# Patient Record
Sex: Female | Born: 1950 | Race: White | Hispanic: No | Marital: Married
Health system: Southern US, Community
[De-identification: ages and names within clinical notes are randomized; demographics above are authoritative.]

---

## 1996-09-08 HISTORY — PX: BREAST BIOPSY: SHX20

## 1999-07-04 ENCOUNTER — Encounter: Payer: Self-pay | Admitting: Family Medicine

## 1999-07-04 ENCOUNTER — Encounter: Admission: RE | Admit: 1999-07-04 | Discharge: 1999-07-04 | Payer: Self-pay | Admitting: Family Medicine

## 1999-07-08 ENCOUNTER — Other Ambulatory Visit: Admission: RE | Admit: 1999-07-08 | Discharge: 1999-07-08 | Payer: Self-pay | Admitting: Family Medicine

## 2000-07-21 ENCOUNTER — Encounter: Payer: Self-pay | Admitting: Family Medicine

## 2000-07-21 ENCOUNTER — Encounter: Admission: RE | Admit: 2000-07-21 | Discharge: 2000-07-21 | Payer: Self-pay | Admitting: Family Medicine

## 2000-07-22 ENCOUNTER — Other Ambulatory Visit: Admission: RE | Admit: 2000-07-22 | Discharge: 2000-07-22 | Payer: Self-pay | Admitting: Family Medicine

## 2001-08-02 ENCOUNTER — Encounter: Admission: RE | Admit: 2001-08-02 | Discharge: 2001-08-02 | Payer: Self-pay | Admitting: Family Medicine

## 2001-08-02 ENCOUNTER — Encounter: Payer: Self-pay | Admitting: Family Medicine

## 2001-08-17 ENCOUNTER — Other Ambulatory Visit: Admission: RE | Admit: 2001-08-17 | Discharge: 2001-08-17 | Payer: Self-pay | Admitting: Family Medicine

## 2001-08-24 ENCOUNTER — Encounter: Payer: Self-pay | Admitting: Family Medicine

## 2001-08-24 ENCOUNTER — Encounter: Admission: RE | Admit: 2001-08-24 | Discharge: 2001-08-24 | Payer: Self-pay | Admitting: Family Medicine

## 2002-08-10 ENCOUNTER — Encounter: Admission: RE | Admit: 2002-08-10 | Discharge: 2002-08-10 | Payer: Self-pay | Admitting: Family Medicine

## 2002-08-10 ENCOUNTER — Encounter: Payer: Self-pay | Admitting: Family Medicine

## 2002-08-19 ENCOUNTER — Other Ambulatory Visit: Admission: RE | Admit: 2002-08-19 | Discharge: 2002-08-19 | Payer: Self-pay | Admitting: Family Medicine

## 2002-12-22 ENCOUNTER — Ambulatory Visit (HOSPITAL_COMMUNITY): Admission: RE | Admit: 2002-12-22 | Discharge: 2002-12-22 | Payer: Self-pay

## 2002-12-22 ENCOUNTER — Encounter (INDEPENDENT_AMBULATORY_CARE_PROVIDER_SITE_OTHER): Payer: Self-pay | Admitting: Specialist

## 2003-08-21 ENCOUNTER — Other Ambulatory Visit: Admission: RE | Admit: 2003-08-21 | Discharge: 2003-08-21 | Payer: Self-pay

## 2003-09-19 ENCOUNTER — Encounter: Admission: RE | Admit: 2003-09-19 | Discharge: 2003-09-19 | Payer: Self-pay | Admitting: Family Medicine

## 2004-09-24 ENCOUNTER — Other Ambulatory Visit: Admission: RE | Admit: 2004-09-24 | Discharge: 2004-09-24 | Payer: Self-pay | Admitting: Family Medicine

## 2004-10-10 ENCOUNTER — Encounter: Admission: RE | Admit: 2004-10-10 | Discharge: 2004-10-10 | Payer: Self-pay | Admitting: Family Medicine

## 2005-10-31 ENCOUNTER — Other Ambulatory Visit: Admission: RE | Admit: 2005-10-31 | Discharge: 2005-10-31 | Payer: Self-pay | Admitting: Family Medicine

## 2005-11-03 ENCOUNTER — Encounter: Admission: RE | Admit: 2005-11-03 | Discharge: 2005-11-03 | Payer: Self-pay | Admitting: Family Medicine

## 2007-01-04 ENCOUNTER — Encounter: Admission: RE | Admit: 2007-01-04 | Discharge: 2007-01-04 | Payer: Self-pay | Admitting: Family Medicine

## 2007-03-03 ENCOUNTER — Other Ambulatory Visit: Admission: RE | Admit: 2007-03-03 | Discharge: 2007-03-03 | Payer: Self-pay | Admitting: Family Medicine

## 2008-04-05 ENCOUNTER — Encounter: Admission: RE | Admit: 2008-04-05 | Discharge: 2008-04-05 | Payer: Self-pay | Admitting: Family Medicine

## 2008-05-31 ENCOUNTER — Other Ambulatory Visit: Admission: RE | Admit: 2008-05-31 | Discharge: 2008-05-31 | Payer: Self-pay | Admitting: Family Medicine

## 2009-04-17 ENCOUNTER — Encounter: Admission: RE | Admit: 2009-04-17 | Discharge: 2009-04-17 | Payer: Self-pay | Admitting: Family Medicine

## 2009-06-20 ENCOUNTER — Other Ambulatory Visit: Admission: RE | Admit: 2009-06-20 | Discharge: 2009-06-20 | Payer: Self-pay | Admitting: Family Medicine

## 2010-04-18 ENCOUNTER — Encounter: Admission: RE | Admit: 2010-04-18 | Discharge: 2010-04-18 | Payer: Self-pay | Admitting: Family Medicine

## 2010-08-23 ENCOUNTER — Other Ambulatory Visit
Admission: RE | Admit: 2010-08-23 | Discharge: 2010-08-23 | Payer: Self-pay | Source: Home / Self Care | Admitting: Family Medicine

## 2010-09-28 ENCOUNTER — Encounter: Payer: Self-pay | Admitting: Family Medicine

## 2011-01-24 NOTE — Op Note (Signed)
   NAME:  [name], Katie                             ACCOUNT NO.:  [id number]   MEDICAL RECORD NO.:  [id number]                   PATIENT TYPE:  AMB   LOCATION:  ENDO                                 FACILITY:  [facility]   PHYSICIAN:  John C. Madilyn Fireman, M.D.                 DATE OF BIRTH:  Nov 18, 1950   DATE OF PROCEDURE:  DATE OF DISCHARGE:                                 OPERATIVE REPORT   PROCEDURE:  Colonoscopy with polypectomy.   ENDOSCOPIST:  Everardo All.  Madilyn Fireman, M.D.   INDICATIONS:  Colon cancer screening.   DESCRIPTION OF PROCEDURE:  The patient was placed in the left lateral  decubitus position and placed on the pulse monitor with continuous low flow  oxygen delivered by nasal cannula.  She was sedated with 100 mcg of IV  fentanyl and 9 mg of IV Versed.  The Olympus video colonoscope was inserted  into the rectum and advanced to the cecum, confirmed by transillumination of  McBurney's point and visualization of the ileocecal valve and appendiceal  orifice.  The prep was excellent.  Within the base of the incision, there  was a 6 mm polyp which was fulgurated by hot biopsy.  The remainder of the  cecum, ascending, transverse and descending colon appeared normal with no  further polyps, masses diverticula or other mucosal abnormalities.  In the  distal sigmoid colon, there was seen a flat 8 mm polyp which was fulgurated  by hot biopsy a few centimeters distal to this.  In the rectosigmoid  junction area was a 1 cm pedunculated polyp which was removed by snare.  Both of the distal polyps were sent in a second specimen container.  The  rectum appeared normal and retroflexed view of the anus revealed no obvious  internal hemorrhoids.  The colonoscope was then withdrawn and the patient  returned to the recovery room in stable condition.  She tolerated the  procedure, and there were no immediate complications.   IMPRESSION:  Cecal, sigmoid and rectosigmoid colon polyps.   PLAN:  Await histology  to determine method and interval for future colon  screening.                                               John C. Madilyn Fireman, M.D.    [facility]  D:  12/22/2002  T:  12/22/2002  Job:  [id number]   cc:   Schuyler Amor, M.D.  [address]  [place], [place] [address]  Fax: [phone]

## 2011-03-20 ENCOUNTER — Other Ambulatory Visit: Payer: Self-pay | Admitting: Family Medicine

## 2011-03-20 DIAGNOSIS — Z1231 Encounter for screening mammogram for malignant neoplasm of breast: Secondary | ICD-10-CM

## 2011-04-15 ENCOUNTER — Other Ambulatory Visit: Payer: Self-pay | Admitting: Gastroenterology

## 2011-05-06 ENCOUNTER — Ambulatory Visit
Admission: RE | Admit: 2011-05-06 | Discharge: 2011-05-06 | Disposition: A | Discharge status: Home / Self Care | Payer: BC Managed Care – PPO | Source: Ambulatory Visit | Attending: Family Medicine | Admitting: Family Medicine

## 2011-05-06 DIAGNOSIS — Z1231 Encounter for screening mammogram for malignant neoplasm of breast: Secondary | ICD-10-CM

## 2011-10-17 ENCOUNTER — Other Ambulatory Visit (HOSPITAL_COMMUNITY)
Admission: RE | Admit: 2011-10-17 | Discharge: 2011-10-17 | Disposition: A | Discharge status: Home / Self Care | Payer: BC Managed Care – PPO | Source: Ambulatory Visit | Attending: Family Medicine | Admitting: Family Medicine

## 2011-10-17 ENCOUNTER — Other Ambulatory Visit: Payer: Self-pay | Admitting: Family Medicine

## 2011-10-17 DIAGNOSIS — Z124 Encounter for screening for malignant neoplasm of cervix: Secondary | ICD-10-CM | POA: Insufficient documentation

## 2013-03-14 ENCOUNTER — Other Ambulatory Visit: Payer: Self-pay

## 2013-03-14 DIAGNOSIS — Z1231 Encounter for screening mammogram for malignant neoplasm of breast: Secondary | ICD-10-CM

## 2013-04-07 ENCOUNTER — Ambulatory Visit: Payer: BC Managed Care – PPO

## 2013-04-13 ENCOUNTER — Ambulatory Visit
Admission: RE | Admit: 2013-04-13 | Discharge: 2013-04-13 | Disposition: A | Discharge status: Home / Self Care | Payer: BC Managed Care – PPO | Source: Ambulatory Visit

## 2013-04-13 DIAGNOSIS — Z1231 Encounter for screening mammogram for malignant neoplasm of breast: Secondary | ICD-10-CM

## 2014-04-05 ENCOUNTER — Other Ambulatory Visit: Payer: Self-pay

## 2014-04-05 DIAGNOSIS — Z1231 Encounter for screening mammogram for malignant neoplasm of breast: Secondary | ICD-10-CM

## 2014-04-20 ENCOUNTER — Ambulatory Visit
Admission: RE | Admit: 2014-04-20 | Discharge: 2014-04-20 | Disposition: A | Discharge status: Home / Self Care | Payer: BC Managed Care – PPO | Source: Ambulatory Visit

## 2014-04-20 ENCOUNTER — Encounter (INDEPENDENT_AMBULATORY_CARE_PROVIDER_SITE_OTHER): Payer: Self-pay

## 2014-04-20 DIAGNOSIS — Z1231 Encounter for screening mammogram for malignant neoplasm of breast: Secondary | ICD-10-CM

## 2015-03-23 ENCOUNTER — Other Ambulatory Visit (HOSPITAL_COMMUNITY)
Admission: RE | Admit: 2015-03-23 | Discharge: 2015-03-23 | Disposition: A | Discharge status: Home / Self Care | Payer: BLUE CROSS/BLUE SHIELD | Source: Ambulatory Visit | Attending: Family Medicine | Admitting: Family Medicine

## 2015-03-23 ENCOUNTER — Other Ambulatory Visit: Payer: Self-pay | Admitting: Family Medicine

## 2015-03-23 DIAGNOSIS — Z01419 Encounter for gynecological examination (general) (routine) without abnormal findings: Secondary | ICD-10-CM | POA: Diagnosis present

## 2015-03-23 DIAGNOSIS — Z1151 Encounter for screening for human papillomavirus (HPV): Secondary | ICD-10-CM | POA: Diagnosis present

## 2015-03-28 LAB — CYTOLOGY - PAP

## 2015-06-06 ENCOUNTER — Other Ambulatory Visit: Payer: Self-pay

## 2015-06-06 DIAGNOSIS — Z1231 Encounter for screening mammogram for malignant neoplasm of breast: Secondary | ICD-10-CM

## 2015-07-12 ENCOUNTER — Ambulatory Visit
Admission: RE | Admit: 2015-07-12 | Discharge: 2015-07-12 | Disposition: A | Discharge status: Home / Self Care | Payer: BLUE CROSS/BLUE SHIELD | Source: Ambulatory Visit

## 2015-07-12 DIAGNOSIS — Z1231 Encounter for screening mammogram for malignant neoplasm of breast: Secondary | ICD-10-CM

## 2016-03-28 DIAGNOSIS — Z961 Presence of intraocular lens: Secondary | ICD-10-CM | POA: Diagnosis not present

## 2016-03-31 DIAGNOSIS — E785 Hyperlipidemia, unspecified: Secondary | ICD-10-CM | POA: Diagnosis not present

## 2016-03-31 DIAGNOSIS — Z23 Encounter for immunization: Secondary | ICD-10-CM | POA: Diagnosis not present

## 2016-03-31 DIAGNOSIS — I10 Essential (primary) hypertension: Secondary | ICD-10-CM | POA: Diagnosis not present

## 2016-03-31 DIAGNOSIS — B009 Herpesviral infection, unspecified: Secondary | ICD-10-CM | POA: Diagnosis not present

## 2016-03-31 DIAGNOSIS — Z0000 Encounter for general adult medical examination without abnormal findings: Secondary | ICD-10-CM | POA: Diagnosis not present

## 2016-05-08 DIAGNOSIS — Z23 Encounter for immunization: Secondary | ICD-10-CM | POA: Diagnosis not present

## 2016-09-17 ENCOUNTER — Other Ambulatory Visit: Payer: Self-pay | Admitting: Family Medicine

## 2016-09-17 DIAGNOSIS — Z1231 Encounter for screening mammogram for malignant neoplasm of breast: Secondary | ICD-10-CM

## 2016-10-14 ENCOUNTER — Ambulatory Visit
Admission: RE | Admit: 2016-10-14 | Discharge: 2016-10-14 | Disposition: A | Discharge status: Home / Self Care | Payer: Medicare Other | Source: Ambulatory Visit | Attending: Family Medicine | Admitting: Family Medicine

## 2016-10-14 DIAGNOSIS — Z1231 Encounter for screening mammogram for malignant neoplasm of breast: Secondary | ICD-10-CM | POA: Diagnosis not present

## 2017-03-30 DIAGNOSIS — Z961 Presence of intraocular lens: Secondary | ICD-10-CM | POA: Diagnosis not present

## 2017-04-13 DIAGNOSIS — E785 Hyperlipidemia, unspecified: Secondary | ICD-10-CM | POA: Diagnosis not present

## 2017-04-13 DIAGNOSIS — B009 Herpesviral infection, unspecified: Secondary | ICD-10-CM | POA: Diagnosis not present

## 2017-04-13 DIAGNOSIS — Z136 Encounter for screening for cardiovascular disorders: Secondary | ICD-10-CM | POA: Diagnosis not present

## 2017-04-13 DIAGNOSIS — Z0000 Encounter for general adult medical examination without abnormal findings: Secondary | ICD-10-CM | POA: Diagnosis not present

## 2017-04-13 DIAGNOSIS — R739 Hyperglycemia, unspecified: Secondary | ICD-10-CM | POA: Diagnosis not present

## 2017-04-13 DIAGNOSIS — I10 Essential (primary) hypertension: Secondary | ICD-10-CM | POA: Diagnosis not present

## 2017-04-13 DIAGNOSIS — Q180 Sinus, fistula and cyst of branchial cleft: Secondary | ICD-10-CM | POA: Diagnosis not present

## 2017-04-13 DIAGNOSIS — Z23 Encounter for immunization: Secondary | ICD-10-CM | POA: Diagnosis not present

## 2017-05-13 DIAGNOSIS — Z23 Encounter for immunization: Secondary | ICD-10-CM | POA: Diagnosis not present

## 2017-06-09 DIAGNOSIS — E2839 Other primary ovarian failure: Secondary | ICD-10-CM | POA: Diagnosis not present

## 2017-12-03 ENCOUNTER — Other Ambulatory Visit: Payer: Self-pay | Admitting: Family Medicine

## 2017-12-03 DIAGNOSIS — Z1231 Encounter for screening mammogram for malignant neoplasm of breast: Secondary | ICD-10-CM

## 2017-12-25 ENCOUNTER — Ambulatory Visit
Admission: RE | Admit: 2017-12-25 | Discharge: 2017-12-25 | Disposition: A | Discharge status: Home / Self Care | Payer: Medicare Other | Source: Ambulatory Visit | Attending: Family Medicine | Admitting: Family Medicine

## 2017-12-25 DIAGNOSIS — Z1231 Encounter for screening mammogram for malignant neoplasm of breast: Secondary | ICD-10-CM

## 2018-04-08 DIAGNOSIS — Z961 Presence of intraocular lens: Secondary | ICD-10-CM | POA: Diagnosis not present

## 2018-04-08 DIAGNOSIS — H43813 Vitreous degeneration, bilateral: Secondary | ICD-10-CM | POA: Diagnosis not present

## 2018-04-27 DIAGNOSIS — E785 Hyperlipidemia, unspecified: Secondary | ICD-10-CM | POA: Diagnosis not present

## 2018-04-27 DIAGNOSIS — R739 Hyperglycemia, unspecified: Secondary | ICD-10-CM | POA: Diagnosis not present

## 2018-04-27 DIAGNOSIS — Z0000 Encounter for general adult medical examination without abnormal findings: Secondary | ICD-10-CM | POA: Diagnosis not present

## 2018-04-27 DIAGNOSIS — I10 Essential (primary) hypertension: Secondary | ICD-10-CM | POA: Diagnosis not present

## 2018-05-12 DIAGNOSIS — Q180 Sinus, fistula and cyst of branchial cleft: Secondary | ICD-10-CM | POA: Diagnosis not present

## 2018-05-21 DIAGNOSIS — Z23 Encounter for immunization: Secondary | ICD-10-CM | POA: Diagnosis not present

## 2019-02-01 ENCOUNTER — Other Ambulatory Visit: Payer: Self-pay | Admitting: Family Medicine

## 2019-02-01 DIAGNOSIS — Z1231 Encounter for screening mammogram for malignant neoplasm of breast: Secondary | ICD-10-CM

## 2019-03-23 ENCOUNTER — Other Ambulatory Visit: Payer: Self-pay

## 2019-03-23 ENCOUNTER — Ambulatory Visit
Admission: RE | Admit: 2019-03-23 | Discharge: 2019-03-23 | Disposition: A | Discharge status: Home / Self Care | Payer: Medicare Other | Source: Ambulatory Visit | Attending: Family Medicine | Admitting: Family Medicine

## 2019-03-23 DIAGNOSIS — Z1231 Encounter for screening mammogram for malignant neoplasm of breast: Secondary | ICD-10-CM

## 2019-04-14 DIAGNOSIS — Z961 Presence of intraocular lens: Secondary | ICD-10-CM | POA: Diagnosis not present

## 2019-04-14 DIAGNOSIS — H43813 Vitreous degeneration, bilateral: Secondary | ICD-10-CM | POA: Diagnosis not present

## 2019-05-05 DIAGNOSIS — L718 Other rosacea: Secondary | ICD-10-CM | POA: Diagnosis not present

## 2019-05-05 DIAGNOSIS — L820 Inflamed seborrheic keratosis: Secondary | ICD-10-CM | POA: Diagnosis not present

## 2019-05-05 DIAGNOSIS — L738 Other specified follicular disorders: Secondary | ICD-10-CM | POA: Diagnosis not present

## 2019-05-05 DIAGNOSIS — D2272 Melanocytic nevi of left lower limb, including hip: Secondary | ICD-10-CM | POA: Diagnosis not present

## 2019-05-05 DIAGNOSIS — D2371 Other benign neoplasm of skin of right lower limb, including hip: Secondary | ICD-10-CM | POA: Diagnosis not present

## 2019-05-05 DIAGNOSIS — L821 Other seborrheic keratosis: Secondary | ICD-10-CM | POA: Diagnosis not present

## 2019-05-19 DIAGNOSIS — Z23 Encounter for immunization: Secondary | ICD-10-CM | POA: Diagnosis not present

## 2019-05-20 DIAGNOSIS — E785 Hyperlipidemia, unspecified: Secondary | ICD-10-CM | POA: Diagnosis not present

## 2019-05-20 DIAGNOSIS — R739 Hyperglycemia, unspecified: Secondary | ICD-10-CM | POA: Diagnosis not present

## 2019-05-20 DIAGNOSIS — I10 Essential (primary) hypertension: Secondary | ICD-10-CM | POA: Diagnosis not present

## 2019-05-20 DIAGNOSIS — Q180 Sinus, fistula and cyst of branchial cleft: Secondary | ICD-10-CM | POA: Diagnosis not present

## 2019-05-20 DIAGNOSIS — Z0000 Encounter for general adult medical examination without abnormal findings: Secondary | ICD-10-CM | POA: Diagnosis not present

## 2019-09-28 ENCOUNTER — Ambulatory Visit: Payer: Medicare Other | Attending: Internal Medicine

## 2019-09-28 DIAGNOSIS — Z23 Encounter for immunization: Secondary | ICD-10-CM | POA: Diagnosis not present

## 2019-09-28 NOTE — Progress Notes (Signed)
   Covid-19 Vaccination Clinic  Name:  Jessyka Atlas    MRN: [id number] DOB: [birth year]  09/28/2019  Ms. Koesters was observed post Covid-19 immunization for 15 minutes without incidence. She was provided with Vaccine Information Sheet and instruction to access the V-Safe system.   Ms. Conneely was instructed to call 911 with any severe reactions post vaccine: Marland Kitchen Difficulty breathing  . Swelling of your face and throat  . A fast heartbeat  . A bad rash all over your body  . Dizziness and weakness    Immunizations Administered    Name Date Dose VIS Date Route   Pfizer COVID-19 Vaccine 09/28/2019  1:44 PM 0.3 mL 08/19/2019 Intramuscular   Manufacturer: [place]   Lot: [id number]   [place]: [id number]

## 2019-10-16 ENCOUNTER — Ambulatory Visit: Payer: Medicare Other | Attending: Internal Medicine

## 2019-10-16 DIAGNOSIS — Z23 Encounter for immunization: Secondary | ICD-10-CM

## 2019-10-16 NOTE — Progress Notes (Signed)
   Covid-19 Vaccination Clinic  Name:  Katie [name]    MRN: [id number] DOB: 1951/09/06  10/16/2019  Ms. Schloemer was observed post Covid-19 immunization for 15 minutes without incidence. She was provided with Vaccine Information Sheet and instruction to access the V-Safe system.   Ms. Pollard was instructed to call 911 with any severe reactions post vaccine: Marland Kitchen Difficulty breathing  . Swelling of your face and throat  . A fast heartbeat  . A bad rash all over your body  . Dizziness and weakness    Immunizations Administered    Name Date Dose VIS Date Route   Pfizer COVID-19 Vaccine 10/16/2019 11:37 AM 0.3 mL 08/19/2019 Intramuscular   Manufacturer: [place]   Lot: [id number]   [place]: [id number]

## 2020-02-22 ENCOUNTER — Other Ambulatory Visit: Payer: Self-pay | Admitting: Family Medicine

## 2020-02-22 DIAGNOSIS — Z1231 Encounter for screening mammogram for malignant neoplasm of breast: Secondary | ICD-10-CM

## 2020-03-23 ENCOUNTER — Ambulatory Visit
Admission: RE | Admit: 2020-03-23 | Discharge: 2020-03-23 | Disposition: A | Discharge status: Home / Self Care | Payer: Medicare Other | Source: Ambulatory Visit | Attending: Family Medicine | Admitting: Family Medicine

## 2020-03-23 ENCOUNTER — Other Ambulatory Visit: Payer: Self-pay

## 2020-03-23 DIAGNOSIS — Z1231 Encounter for screening mammogram for malignant neoplasm of breast: Secondary | ICD-10-CM

## 2020-04-26 DIAGNOSIS — Z961 Presence of intraocular lens: Secondary | ICD-10-CM | POA: Diagnosis not present

## 2020-04-27 DIAGNOSIS — Z23 Encounter for immunization: Secondary | ICD-10-CM | POA: Diagnosis not present

## 2020-05-31 DIAGNOSIS — I10 Essential (primary) hypertension: Secondary | ICD-10-CM | POA: Diagnosis not present

## 2020-05-31 DIAGNOSIS — E785 Hyperlipidemia, unspecified: Secondary | ICD-10-CM | POA: Diagnosis not present

## 2020-05-31 DIAGNOSIS — B009 Herpesviral infection, unspecified: Secondary | ICD-10-CM | POA: Diagnosis not present

## 2020-05-31 DIAGNOSIS — Z0000 Encounter for general adult medical examination without abnormal findings: Secondary | ICD-10-CM | POA: Diagnosis not present

## 2020-06-06 DIAGNOSIS — Z23 Encounter for immunization: Secondary | ICD-10-CM | POA: Diagnosis not present

## 2020-09-06 DIAGNOSIS — Z20822 Contact with and (suspected) exposure to covid-19: Secondary | ICD-10-CM | POA: Diagnosis not present

## 2020-09-06 DIAGNOSIS — Z03818 Encounter for observation for suspected exposure to other biological agents ruled out: Secondary | ICD-10-CM | POA: Diagnosis not present

## 2020-11-01 DIAGNOSIS — Z01812 Encounter for preprocedural laboratory examination: Secondary | ICD-10-CM | POA: Diagnosis not present

## 2020-11-06 DIAGNOSIS — K648 Other hemorrhoids: Secondary | ICD-10-CM | POA: Diagnosis not present

## 2020-11-06 DIAGNOSIS — D122 Benign neoplasm of ascending colon: Secondary | ICD-10-CM | POA: Diagnosis not present

## 2020-11-06 DIAGNOSIS — D120 Benign neoplasm of cecum: Secondary | ICD-10-CM | POA: Diagnosis not present

## 2020-11-06 DIAGNOSIS — D124 Benign neoplasm of descending colon: Secondary | ICD-10-CM | POA: Diagnosis not present

## 2020-11-06 DIAGNOSIS — K621 Rectal polyp: Secondary | ICD-10-CM | POA: Diagnosis not present

## 2020-11-06 DIAGNOSIS — Z86010 Personal history of colonic polyps: Secondary | ICD-10-CM | POA: Diagnosis not present

## 2020-11-09 DIAGNOSIS — K621 Rectal polyp: Secondary | ICD-10-CM | POA: Diagnosis not present

## 2020-11-09 DIAGNOSIS — D122 Benign neoplasm of ascending colon: Secondary | ICD-10-CM | POA: Diagnosis not present

## 2020-11-09 DIAGNOSIS — D124 Benign neoplasm of descending colon: Secondary | ICD-10-CM | POA: Diagnosis not present

## 2020-11-09 DIAGNOSIS — D120 Benign neoplasm of cecum: Secondary | ICD-10-CM | POA: Diagnosis not present

## 2021-02-25 ENCOUNTER — Other Ambulatory Visit: Payer: Self-pay | Admitting: Family Medicine

## 2021-02-25 DIAGNOSIS — Z1231 Encounter for screening mammogram for malignant neoplasm of breast: Secondary | ICD-10-CM

## 2021-04-02 ENCOUNTER — Ambulatory Visit
Admission: RE | Admit: 2021-04-02 | Discharge: 2021-04-02 | Disposition: A | Discharge status: Home / Self Care | Payer: Medicare Other | Source: Ambulatory Visit | Attending: Family Medicine | Admitting: Family Medicine

## 2021-04-02 ENCOUNTER — Other Ambulatory Visit: Payer: Self-pay

## 2021-04-02 DIAGNOSIS — Z1231 Encounter for screening mammogram for malignant neoplasm of breast: Secondary | ICD-10-CM | POA: Diagnosis not present

## 2021-04-26 DIAGNOSIS — M1612 Unilateral primary osteoarthritis, left hip: Secondary | ICD-10-CM | POA: Diagnosis not present

## 2021-04-26 DIAGNOSIS — M25552 Pain in left hip: Secondary | ICD-10-CM | POA: Diagnosis not present

## 2021-04-26 DIAGNOSIS — M1611 Unilateral primary osteoarthritis, right hip: Secondary | ICD-10-CM | POA: Diagnosis not present

## 2021-04-26 DIAGNOSIS — M25551 Pain in right hip: Secondary | ICD-10-CM | POA: Diagnosis not present

## 2021-05-02 DIAGNOSIS — Z961 Presence of intraocular lens: Secondary | ICD-10-CM | POA: Diagnosis not present

## 2021-06-10 DIAGNOSIS — Z79899 Other long term (current) drug therapy: Secondary | ICD-10-CM | POA: Diagnosis not present

## 2021-06-10 DIAGNOSIS — Z0001 Encounter for general adult medical examination with abnormal findings: Secondary | ICD-10-CM | POA: Diagnosis not present

## 2021-06-10 DIAGNOSIS — Z23 Encounter for immunization: Secondary | ICD-10-CM | POA: Diagnosis not present

## 2021-06-10 DIAGNOSIS — I10 Essential (primary) hypertension: Secondary | ICD-10-CM | POA: Diagnosis not present

## 2021-06-10 DIAGNOSIS — E7800 Pure hypercholesterolemia, unspecified: Secondary | ICD-10-CM | POA: Diagnosis not present

## 2021-08-30 IMAGING — MG MM DIGITAL SCREENING BILAT W/ TOMO AND CAD
8 series · 8 of 24 positions shown · non-contrast
Comparison: Previous exam(s).

CLINICAL DATA: Screening.

EXAM:
DIGITAL SCREENING BILATERAL MAMMOGRAM WITH TOMOSYNTHESIS AND CAD
TECHNIQUE: Bilateral screening digital craniocaudal and mediolateral oblique
mammograms were obtained. Bilateral screening digital breast
tomosynthesis was performed. The images were evaluated with
computer-aided detection.

[L MLO synth-2D]
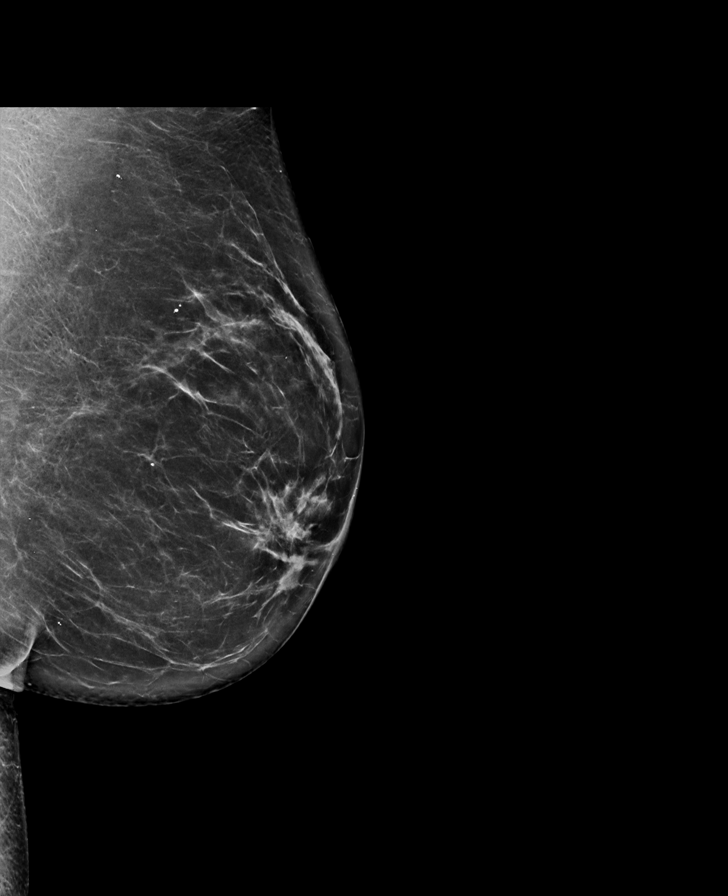

[R CC synth-2D]
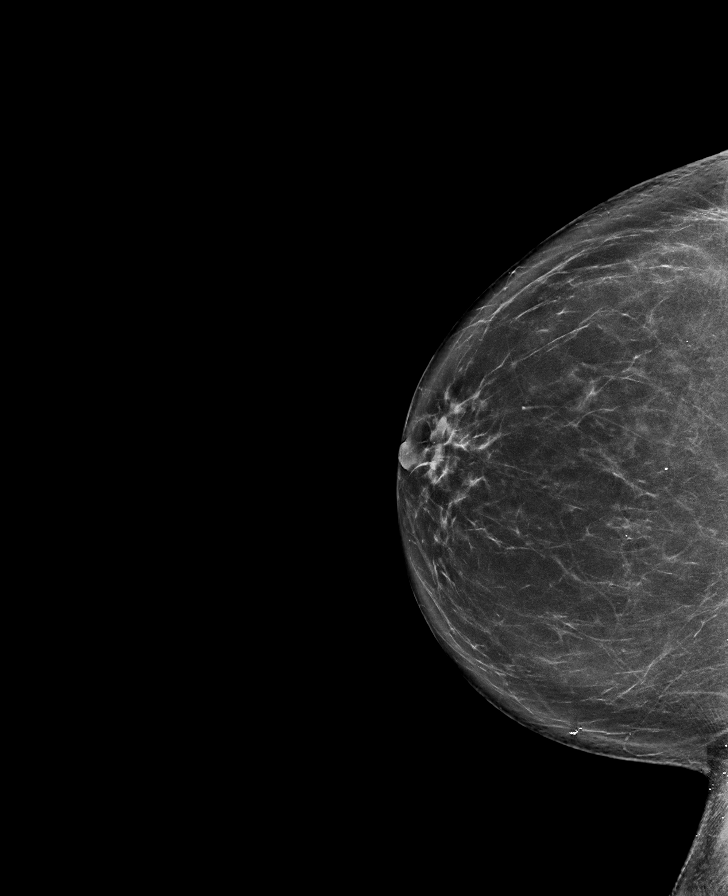

[L CC synth-2D]
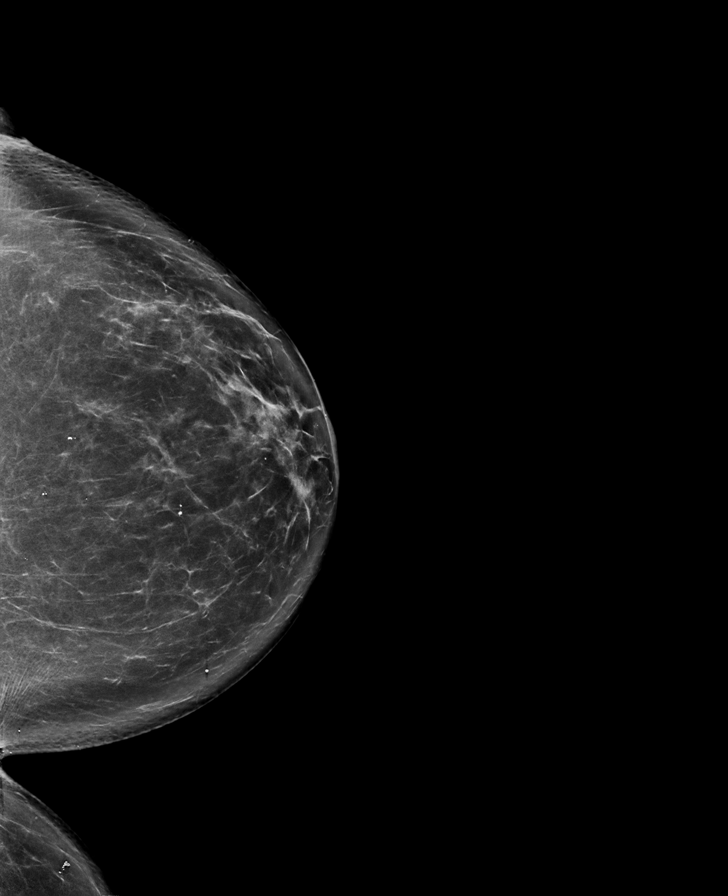

[R MLO synth-2D]
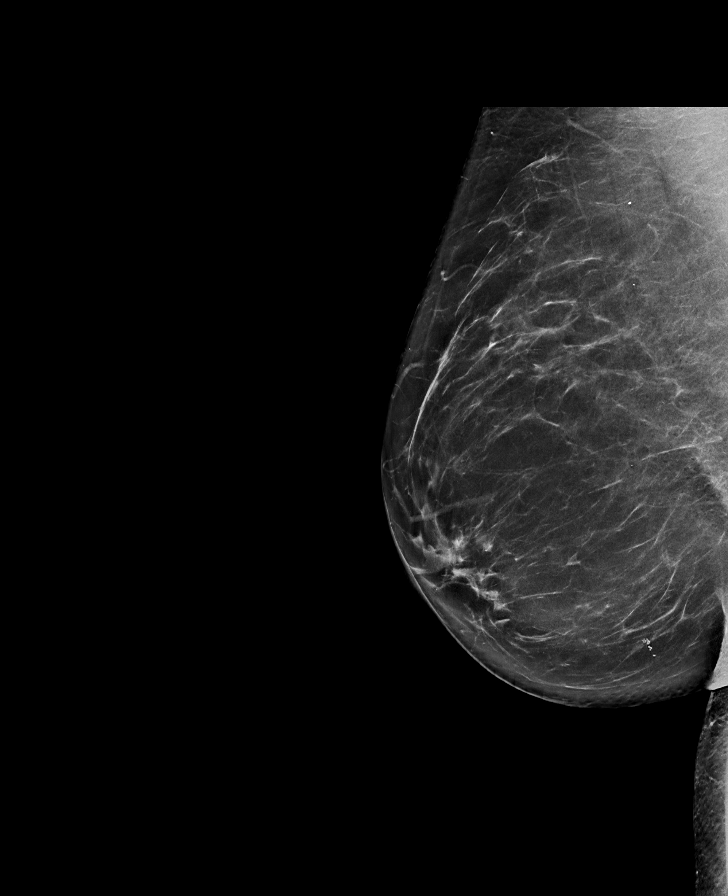

[R CC tomo · tomo slice 41/80.0]
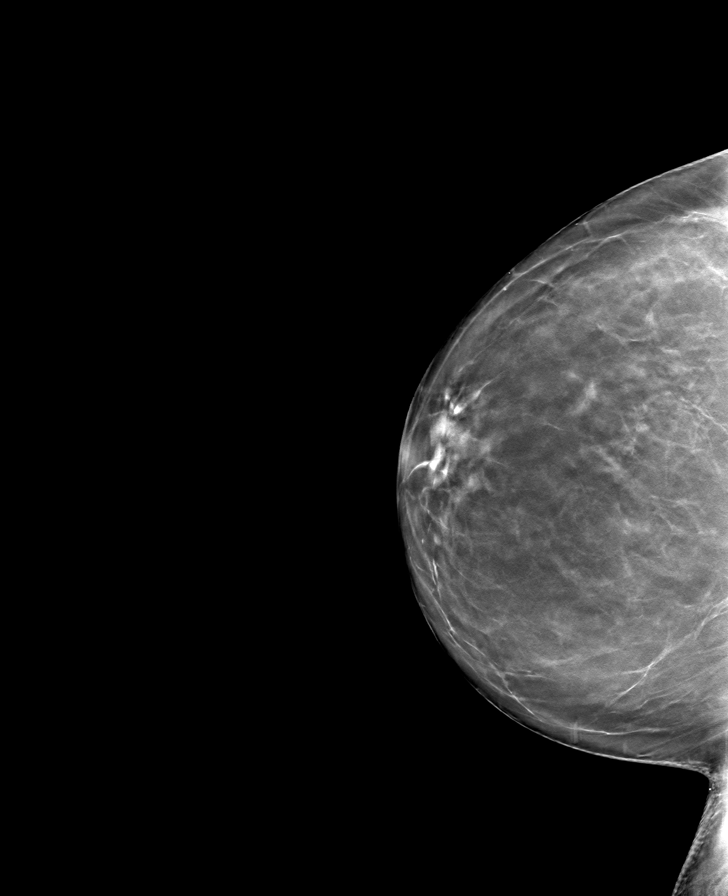

[L MLO tomo · tomo slice 41/82.0]
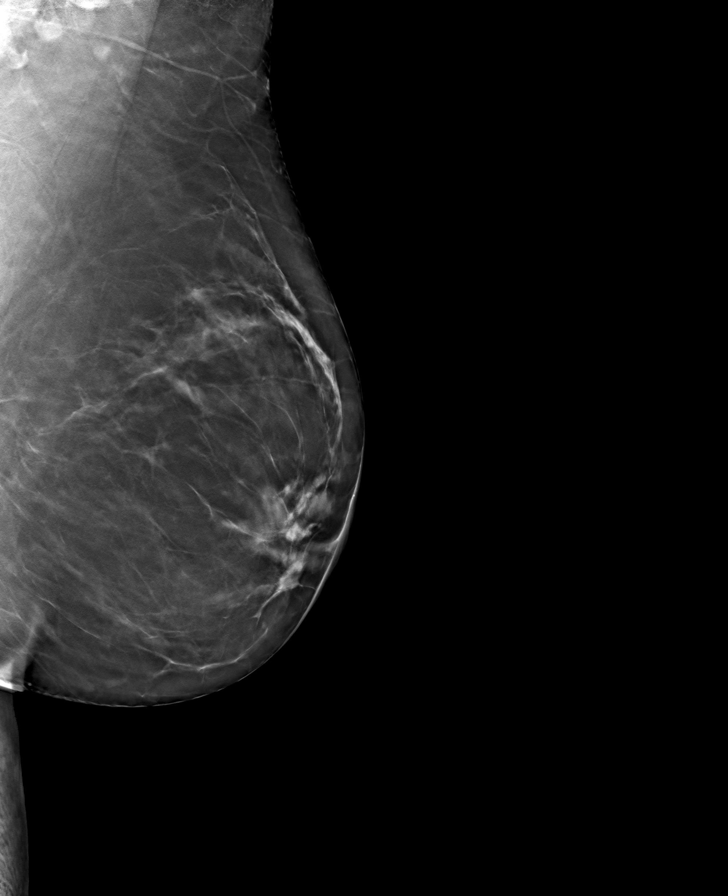

[L CC tomo · tomo slice 42/83.0]
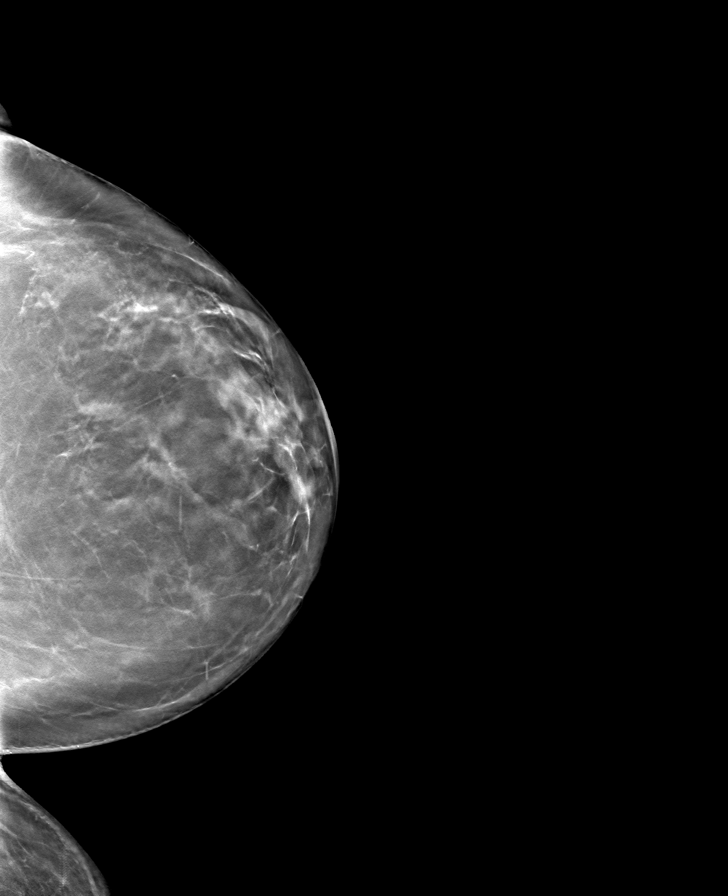

[R MLO tomo · tomo slice 42/83.0]
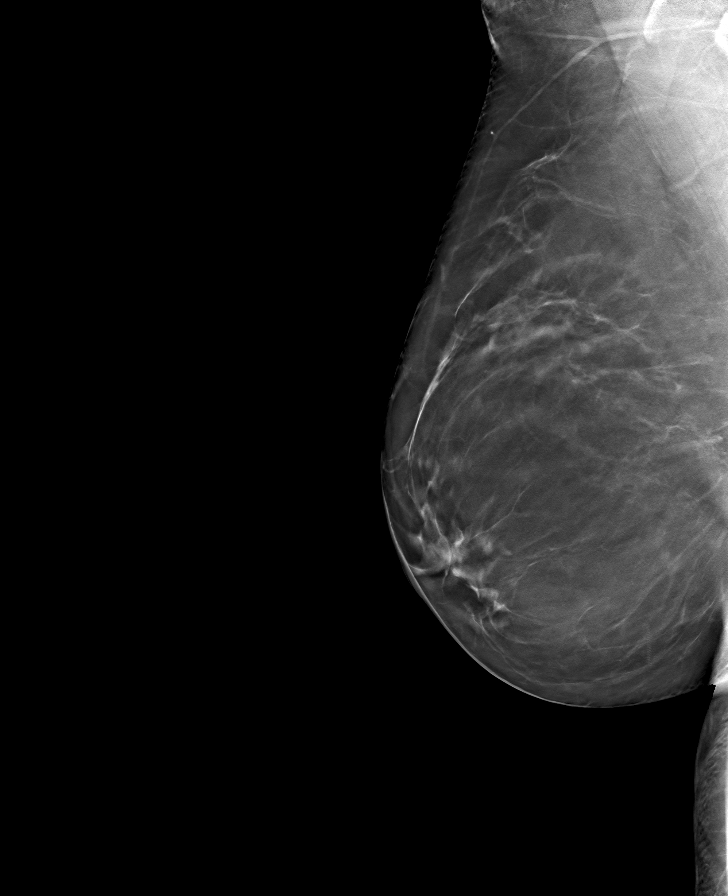

[8 of 24 positions shown; findings below may reference images not displayed]

ACR Breast Density Category b: There are scattered areas of
fibroglandular density.
FINDINGS: There are no findings suspicious for malignancy.
IMPRESSION: No mammographic evidence of malignancy. A result letter of this
screening mammogram will be mailed directly to the patient.

RECOMMENDATION:
Screening mammogram in one year. (Code:51-O-LD2)

BI-RADS CATEGORY  1: Negative.

## 2022-05-17 DIAGNOSIS — Z23 Encounter for immunization: Secondary | ICD-10-CM | POA: Diagnosis not present

## 2022-06-05 DIAGNOSIS — Z961 Presence of intraocular lens: Secondary | ICD-10-CM | POA: Diagnosis not present

## 2022-06-30 ENCOUNTER — Other Ambulatory Visit: Payer: Self-pay | Admitting: Family Medicine

## 2022-06-30 DIAGNOSIS — Z77090 Contact with and (suspected) exposure to asbestos: Secondary | ICD-10-CM | POA: Diagnosis not present

## 2022-06-30 DIAGNOSIS — Z0000 Encounter for general adult medical examination without abnormal findings: Secondary | ICD-10-CM | POA: Diagnosis not present

## 2022-06-30 DIAGNOSIS — E785 Hyperlipidemia, unspecified: Secondary | ICD-10-CM | POA: Diagnosis not present

## 2022-06-30 DIAGNOSIS — Z1231 Encounter for screening mammogram for malignant neoplasm of breast: Secondary | ICD-10-CM

## 2022-06-30 DIAGNOSIS — I10 Essential (primary) hypertension: Secondary | ICD-10-CM | POA: Diagnosis not present

## 2022-07-01 ENCOUNTER — Ambulatory Visit
Admission: RE | Admit: 2022-07-01 | Discharge: 2022-07-01 | Disposition: A | Discharge status: Home / Self Care | Payer: Medicare Other | Source: Ambulatory Visit | Attending: Family Medicine | Admitting: Family Medicine

## 2022-07-01 ENCOUNTER — Other Ambulatory Visit: Payer: Self-pay | Admitting: Family Medicine

## 2022-07-01 DIAGNOSIS — Z1231 Encounter for screening mammogram for malignant neoplasm of breast: Secondary | ICD-10-CM

## 2022-07-02 ENCOUNTER — Other Ambulatory Visit: Payer: Self-pay | Admitting: Family Medicine

## 2022-07-02 ENCOUNTER — Ambulatory Visit
Admission: RE | Admit: 2022-07-02 | Discharge: 2022-07-02 | Disposition: A | Discharge status: Home / Self Care | Payer: Medicare Other | Source: Ambulatory Visit | Attending: Family Medicine | Admitting: Family Medicine

## 2022-07-02 DIAGNOSIS — Z77090 Contact with and (suspected) exposure to asbestos: Secondary | ICD-10-CM | POA: Diagnosis not present

## 2023-06-01 ENCOUNTER — Other Ambulatory Visit: Payer: Self-pay | Admitting: Family Medicine

## 2023-06-01 DIAGNOSIS — Z0000 Encounter for general adult medical examination without abnormal findings: Secondary | ICD-10-CM

## 2023-06-15 DIAGNOSIS — Z961 Presence of intraocular lens: Secondary | ICD-10-CM | POA: Diagnosis not present

## 2023-07-06 DIAGNOSIS — Z23 Encounter for immunization: Secondary | ICD-10-CM | POA: Diagnosis not present

## 2023-07-07 ENCOUNTER — Ambulatory Visit
Admission: RE | Admit: 2023-07-07 | Discharge: 2023-07-07 | Disposition: A | Discharge status: Home / Self Care | Payer: Medicare Other | Source: Ambulatory Visit | Attending: Family Medicine | Admitting: Family Medicine

## 2023-07-07 DIAGNOSIS — Z1231 Encounter for screening mammogram for malignant neoplasm of breast: Secondary | ICD-10-CM | POA: Diagnosis not present

## 2023-07-07 DIAGNOSIS — Z0000 Encounter for general adult medical examination without abnormal findings: Secondary | ICD-10-CM

## 2023-07-08 ENCOUNTER — Other Ambulatory Visit: Payer: Self-pay | Admitting: Family Medicine

## 2023-07-08 DIAGNOSIS — E2839 Other primary ovarian failure: Secondary | ICD-10-CM | POA: Diagnosis not present

## 2023-07-08 DIAGNOSIS — Z0000 Encounter for general adult medical examination without abnormal findings: Secondary | ICD-10-CM | POA: Diagnosis not present

## 2023-07-08 DIAGNOSIS — I10 Essential (primary) hypertension: Secondary | ICD-10-CM | POA: Diagnosis not present

## 2023-07-08 DIAGNOSIS — B009 Herpesviral infection, unspecified: Secondary | ICD-10-CM | POA: Diagnosis not present

## 2023-07-08 DIAGNOSIS — Z23 Encounter for immunization: Secondary | ICD-10-CM | POA: Diagnosis not present

## 2023-07-08 DIAGNOSIS — R739 Hyperglycemia, unspecified: Secondary | ICD-10-CM | POA: Diagnosis not present

## 2023-07-08 DIAGNOSIS — E785 Hyperlipidemia, unspecified: Secondary | ICD-10-CM | POA: Diagnosis not present

## 2023-12-02 DIAGNOSIS — I10 Essential (primary) hypertension: Secondary | ICD-10-CM | POA: Diagnosis not present

## 2023-12-07 DIAGNOSIS — E785 Hyperlipidemia, unspecified: Secondary | ICD-10-CM | POA: Diagnosis not present

## 2023-12-07 DIAGNOSIS — I10 Essential (primary) hypertension: Secondary | ICD-10-CM | POA: Diagnosis not present

## 2023-12-31 DIAGNOSIS — I10 Essential (primary) hypertension: Secondary | ICD-10-CM | POA: Diagnosis not present

## 2024-01-06 DIAGNOSIS — E785 Hyperlipidemia, unspecified: Secondary | ICD-10-CM | POA: Diagnosis not present

## 2024-01-06 DIAGNOSIS — I10 Essential (primary) hypertension: Secondary | ICD-10-CM | POA: Diagnosis not present

## 2024-01-30 DIAGNOSIS — I10 Essential (primary) hypertension: Secondary | ICD-10-CM | POA: Diagnosis not present

## 2024-02-06 DIAGNOSIS — E785 Hyperlipidemia, unspecified: Secondary | ICD-10-CM | POA: Diagnosis not present

## 2024-02-06 DIAGNOSIS — I10 Essential (primary) hypertension: Secondary | ICD-10-CM | POA: Diagnosis not present

## 2024-02-18 ENCOUNTER — Other Ambulatory Visit: Payer: Medicare Other

## 2024-02-25 ENCOUNTER — Ambulatory Visit (HOSPITAL_BASED_OUTPATIENT_CLINIC_OR_DEPARTMENT_OTHER)
Admission: RE | Admit: 2024-02-25 | Discharge: 2024-02-25 | Disposition: A | Discharge status: Home / Self Care | Source: Ambulatory Visit | Attending: Family Medicine | Admitting: Family Medicine

## 2024-02-25 DIAGNOSIS — E2839 Other primary ovarian failure: Secondary | ICD-10-CM | POA: Diagnosis not present

## 2024-02-25 DIAGNOSIS — Z780 Asymptomatic menopausal state: Secondary | ICD-10-CM | POA: Diagnosis not present

## 2024-02-29 DIAGNOSIS — I10 Essential (primary) hypertension: Secondary | ICD-10-CM | POA: Diagnosis not present

## 2024-03-30 DIAGNOSIS — I10 Essential (primary) hypertension: Secondary | ICD-10-CM | POA: Diagnosis not present

## 2024-04-07 DIAGNOSIS — E785 Hyperlipidemia, unspecified: Secondary | ICD-10-CM | POA: Diagnosis not present

## 2024-04-07 DIAGNOSIS — I10 Essential (primary) hypertension: Secondary | ICD-10-CM | POA: Diagnosis not present

## 2024-04-29 DIAGNOSIS — I10 Essential (primary) hypertension: Secondary | ICD-10-CM | POA: Diagnosis not present

## 2024-05-08 DIAGNOSIS — E785 Hyperlipidemia, unspecified: Secondary | ICD-10-CM | POA: Diagnosis not present

## 2024-05-08 DIAGNOSIS — I10 Essential (primary) hypertension: Secondary | ICD-10-CM | POA: Diagnosis not present

## 2024-05-29 DIAGNOSIS — I10 Essential (primary) hypertension: Secondary | ICD-10-CM | POA: Diagnosis not present

## 2024-06-06 DIAGNOSIS — Z23 Encounter for immunization: Secondary | ICD-10-CM | POA: Diagnosis not present

## 2024-06-07 DIAGNOSIS — I10 Essential (primary) hypertension: Secondary | ICD-10-CM | POA: Diagnosis not present

## 2024-06-07 DIAGNOSIS — E785 Hyperlipidemia, unspecified: Secondary | ICD-10-CM | POA: Diagnosis not present

## 2024-06-23 DIAGNOSIS — Z961 Presence of intraocular lens: Secondary | ICD-10-CM | POA: Diagnosis not present

## 2024-06-28 DIAGNOSIS — I10 Essential (primary) hypertension: Secondary | ICD-10-CM | POA: Diagnosis not present

## 2024-07-08 DIAGNOSIS — E785 Hyperlipidemia, unspecified: Secondary | ICD-10-CM | POA: Diagnosis not present

## 2024-07-08 DIAGNOSIS — I10 Essential (primary) hypertension: Secondary | ICD-10-CM | POA: Diagnosis not present

## 2024-07-12 DIAGNOSIS — I10 Essential (primary) hypertension: Secondary | ICD-10-CM | POA: Diagnosis not present

## 2024-07-12 DIAGNOSIS — Z0189 Encounter for other specified special examinations: Secondary | ICD-10-CM | POA: Diagnosis not present

## 2024-07-12 DIAGNOSIS — R059 Cough, unspecified: Secondary | ICD-10-CM | POA: Diagnosis not present

## 2024-07-18 DIAGNOSIS — B009 Herpesviral infection, unspecified: Secondary | ICD-10-CM | POA: Diagnosis not present

## 2024-07-18 DIAGNOSIS — Z23 Encounter for immunization: Secondary | ICD-10-CM | POA: Diagnosis not present

## 2024-07-18 DIAGNOSIS — R739 Hyperglycemia, unspecified: Secondary | ICD-10-CM | POA: Diagnosis not present

## 2024-07-18 DIAGNOSIS — Z0000 Encounter for general adult medical examination without abnormal findings: Secondary | ICD-10-CM | POA: Diagnosis not present

## 2024-07-18 DIAGNOSIS — E785 Hyperlipidemia, unspecified: Secondary | ICD-10-CM | POA: Diagnosis not present

## 2024-07-18 DIAGNOSIS — R0683 Snoring: Secondary | ICD-10-CM | POA: Diagnosis not present

## 2024-07-18 DIAGNOSIS — I10 Essential (primary) hypertension: Secondary | ICD-10-CM | POA: Diagnosis not present

## 2024-07-18 DIAGNOSIS — Z79899 Other long term (current) drug therapy: Secondary | ICD-10-CM | POA: Diagnosis not present

## 2024-07-28 DIAGNOSIS — I10 Essential (primary) hypertension: Secondary | ICD-10-CM | POA: Diagnosis not present

## 2024-09-19 ENCOUNTER — Other Ambulatory Visit: Payer: Self-pay | Admitting: Family Medicine

## 2024-09-19 DIAGNOSIS — Z1231 Encounter for screening mammogram for malignant neoplasm of breast: Secondary | ICD-10-CM

## 2024-09-20 ENCOUNTER — Inpatient Hospital Stay: Admission: RE | Admit: 2024-09-20 | Discharge: 2024-09-20 | Attending: Family Medicine

## 2024-09-20 DIAGNOSIS — Z1231 Encounter for screening mammogram for malignant neoplasm of breast: Secondary | ICD-10-CM
